# Patient Record
Sex: Male | Born: 1965 | Race: Black or African American | Hispanic: No | Marital: Single
Health system: Southern US, Community
[De-identification: ages and names within clinical notes are randomized; demographics above are authoritative.]

---

## 2017-01-15 ENCOUNTER — Emergency Department
Admission: EM | Admit: 2017-01-15 | Discharge: 2017-01-15 | Disposition: A | Discharge status: Home / Self Care | Payer: Medicaid - Out of State | Attending: Emergency Medicine | Admitting: Emergency Medicine

## 2017-01-15 ENCOUNTER — Emergency Department: Payer: Medicaid - Out of State

## 2017-01-15 DIAGNOSIS — N50811 Right testicular pain: Secondary | ICD-10-CM | POA: Diagnosis present

## 2017-01-15 DIAGNOSIS — N433 Hydrocele, unspecified: Secondary | ICD-10-CM | POA: Diagnosis not present

## 2017-01-15 DIAGNOSIS — N5082 Scrotal pain: Secondary | ICD-10-CM

## 2017-01-15 NOTE — ED Provider Notes (Signed)
[facility] Emergency Department Provider Note  Time seen: 2:31 PM  I have reviewed the triage vital signs and the nursing notes.   HISTORY  Chief Complaint Testicle Pain    HPI Lars MassonRonnie Crute is a 51 y.o. male with a past medical history of epididymitis, who presents to the emergency department for right-sided testicular pain. According to the patient for the past 2-3 years she has been experiencing intermittent pain especially his right testicle. He states over the past one year and has becoming worse and more frequent. Patient has been seen in [place] where he previously lived several times at the hospital and was told that it was epididymitis. Patient denies any penile discharge. Denies any dysuria, fever, nausea vomiting or diarrhea. Denies any swelling of the testicles or discoloration. Patient states it feels like the normal pain that he has 24/7 but feels like it is getting worse.  No past medical history on file.  There are no active problems to display for this patient.   No past surgical history on file.  Prior to Admission medications   Not on File    Allergies  Allergen Reactions  . Acetaminophen Hives  . Aspirin Hives    No family history on file.  Social History Social History  Substance Use Topics  . Smoking status: Not on file  . Smokeless tobacco: Not on file  . Alcohol use Not on file    Review of Systems Constitutional: Negative for fever. Cardiovascular: Negative for chest pain. Respiratory: Negative for shortness of breath. Gastrointestinal: Negative for abdominal pain, vomiting  Genitourinary: Negative for dysuria. Positive for right testicular pain Musculoskeletal: Negative for back pain All other ROS negative  ____________________________________________   PHYSICAL EXAM:  VITAL SIGNS: ED Triage Vitals [01/15/17 1218]  Enc Vitals Group     BP (!) 173/97     Pulse Rate 82     Resp 18     Temp 98.7 F (37.1  C)     Temp Source Oral     SpO2 100 %     Weight 180 lb (81.6 kg)     Height 5\' 10"  (1.778 m)     Head Circumference      Peak Flow      Pain Score 6     Pain Loc      Pain Edu?      Excl. in GC?     Constitutional: Alert and oriented. Well appearing and in no distress. Eyes: Normal exam ENT   Head: Normocephalic and atraumatic.   Mouth/Throat: Mucous membranes are moist. Cardiovascular: Normal rate, regular rhythm. No murmur Respiratory: Normal respiratory effort without tachypnea nor retractions. Breath sounds are clear Gastrointestinal: Soft and nontender. No distention. Genitourinary: Normal GU appearance, moderate tenderness to the right epididymis, testicles themselves appear normal. No edema or discoloration. Musculoskeletal: Nontender with normal range of motion in all extremities.  Neurologic:  Normal speech and language. No gross focal neurologic deficits  Skin:  Skin is warm, dry and intact.  Psychiatric: Mood and affect are normal.  ____________________________________________   RADIOLOGY  IMPRESSION: 1. Normal appearance of the testicles. No evidence of torsion. 2. Small hydroceles.  ____________________________________________   INITIAL IMPRESSION / ASSESSMENT AND PLAN / ED COURSE  Pertinent labs & imaging results that were available during my care of the patient were reviewed by me and considered in my medical decision making (see chart for details).  Patient with recurrent groin/testicular pain intermittent over the past 2-3 years.  Normal exam with mild to moderate epididymis tenderness on the right. No edema or discoloration. Ultrasound shows bilateral hydroceles otherwise negative. Given the patient's chronic discomfort I suggested he follow up with the urologist which he has not done yet. Patient is agreeable to this plan.  ____________________________________________   FINAL CLINICAL IMPRESSION(S) / ED DIAGNOSES  Testicular  pain Hydrocele    Minna AntisPaduchowski, Katalea Ucci, MD 01/15/17 1434

## 2017-01-15 NOTE — ED Triage Notes (Signed)
Pt reports increasing scrotum pain for three weeks. Denies discharge. Pt reports frequent epididymitis.

## 2017-01-15 NOTE — Discharge Instructions (Signed)
Please call the number provided for neurology to arrange a follow-up appointment as soon as possible. Return to the emergency department for any sudden or significant worsening of discomfort, fever, or any other symptom personally concerning to yourself.

## 2017-01-15 NOTE — ED Notes (Addendum)
Pt states he has had epididymitis x 2 years of being diagnosed. States he has had a lot longer. Denies urinary symptoms. States R sided testicular pain.

## 2019-04-17 IMAGING — US US SCROTUM
1 series · 14 of 25 positions shown · non-contrast
Comparison: None.

CLINICAL DATA: Right scrotal pain for 2 weeks.

EXAM:
SCROTAL ULTRASOUND
DOPPLER ULTRASOUND OF THE TESTICLES
TECHNIQUE: Complete ultrasound examination of the testicles, epididymis, and
other scrotal structures was performed. Color and spectral Doppler
ultrasound were also utilized to evaluate blood flow to the
testicles.

[Series 1: us scrotum · 0.08mm/px · 14 of 90 slices shown]
[im 1/90]
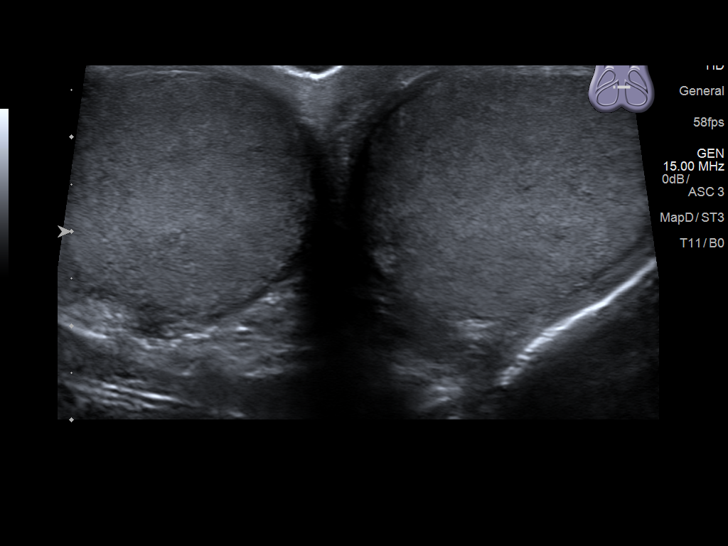
[im 8/90]
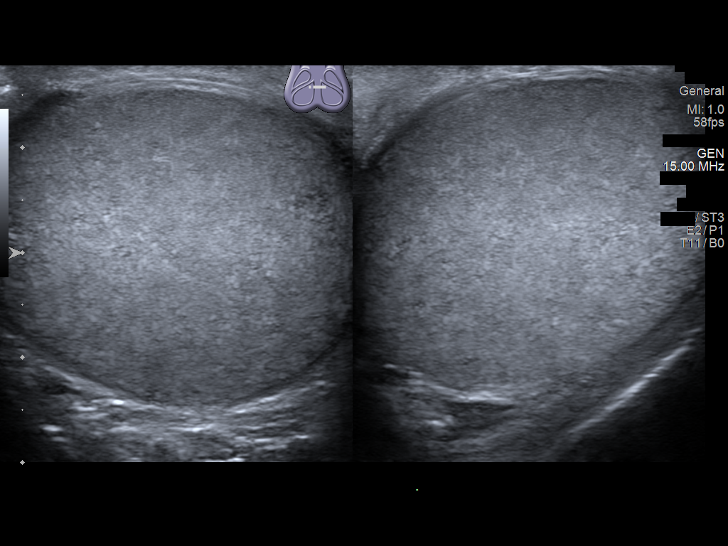
[im 15/90]
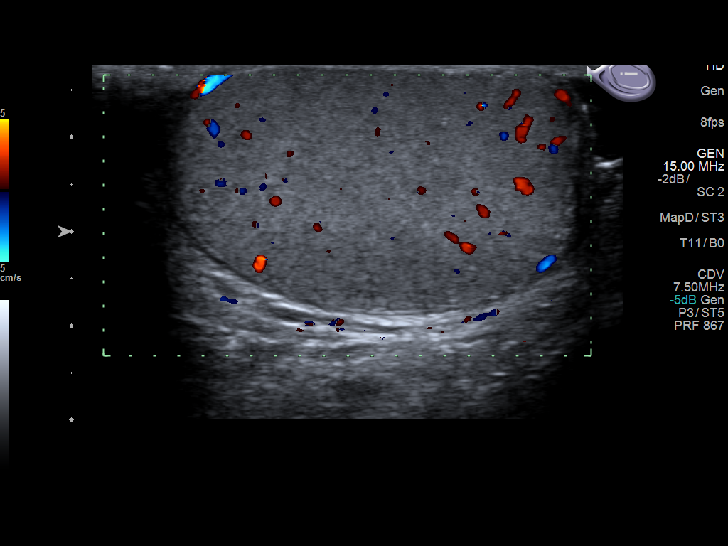
[im 23/90]
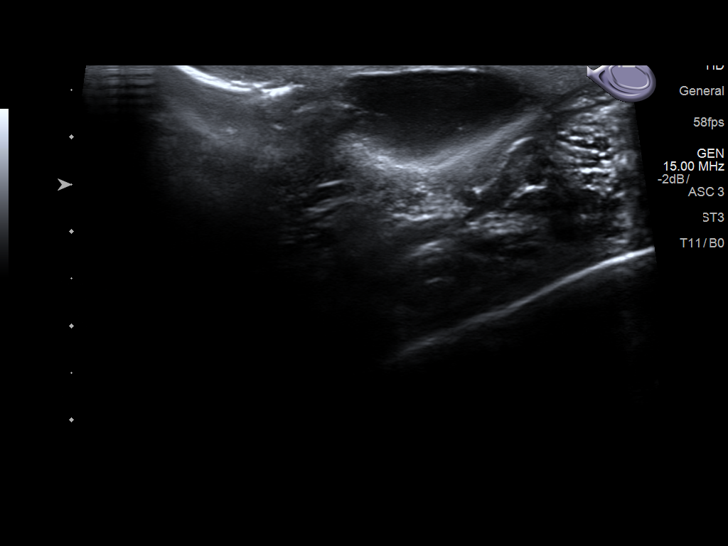
[im 30/90]
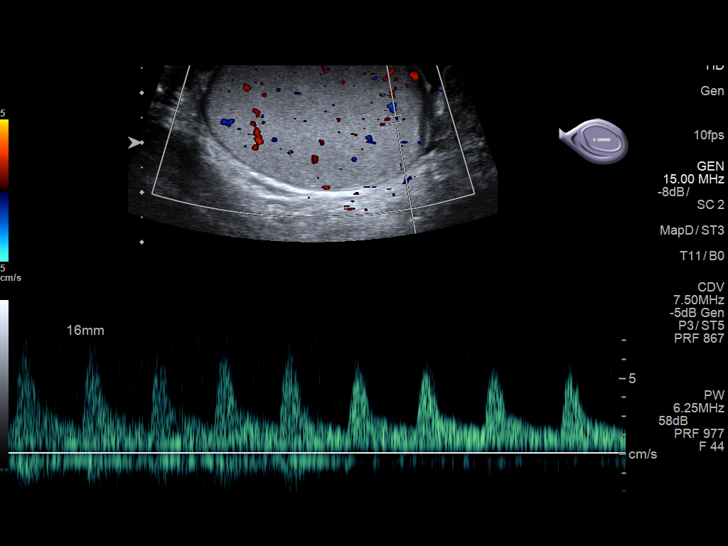
[im 34/90]
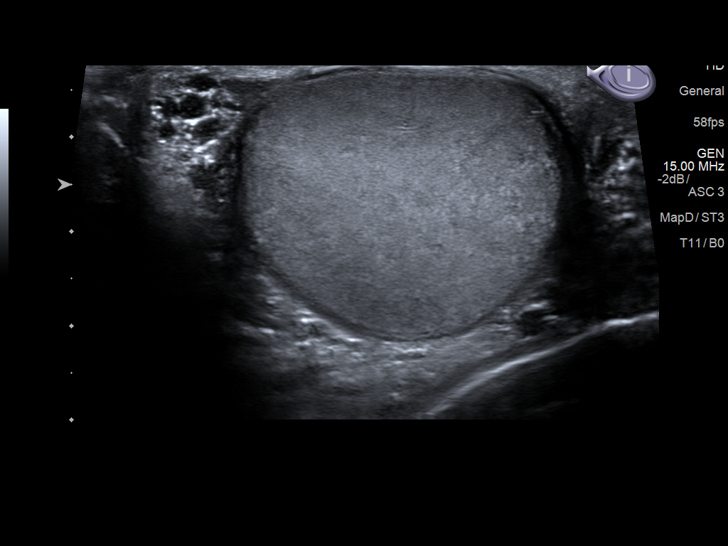
[im 41/90]
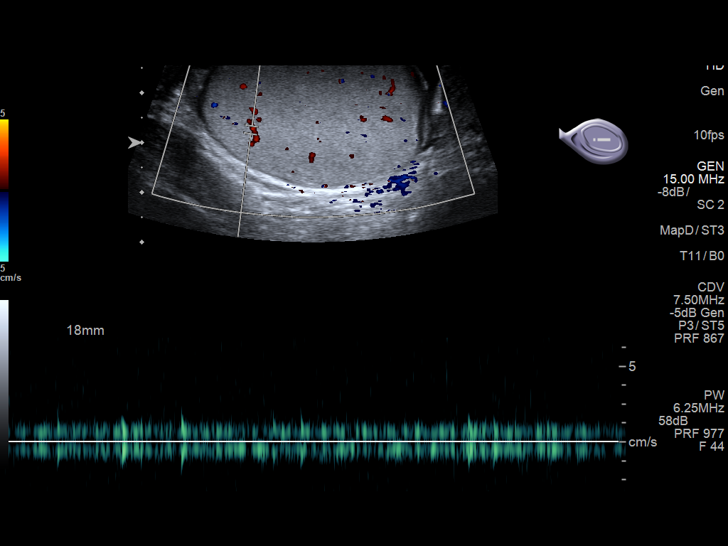
[im 49/90]
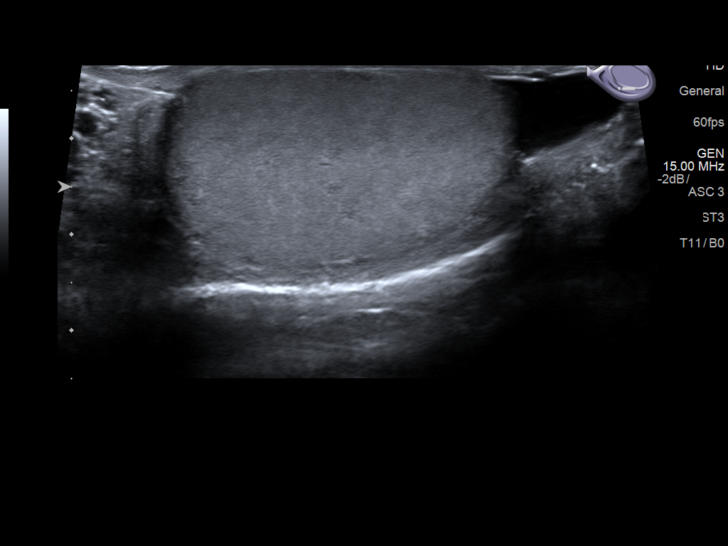
[im 56/90]
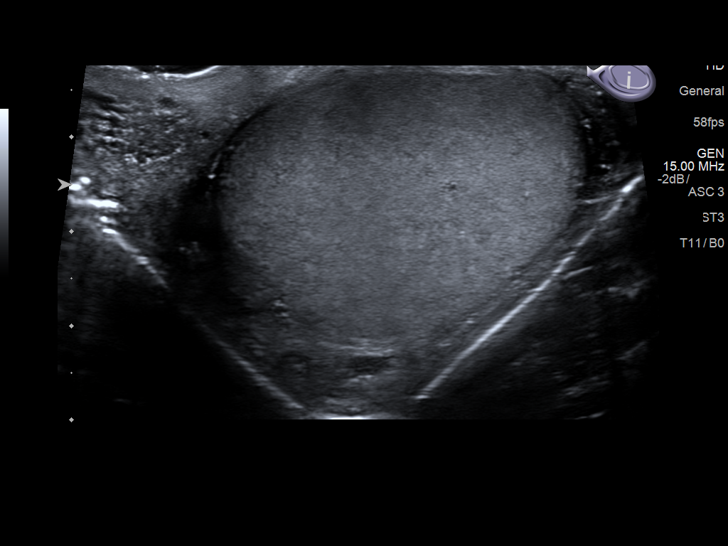
[im 60/90]
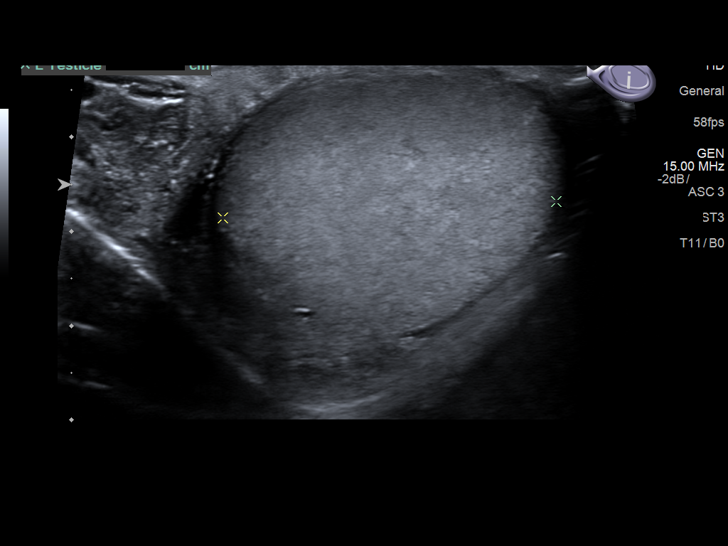
[im 67/90]
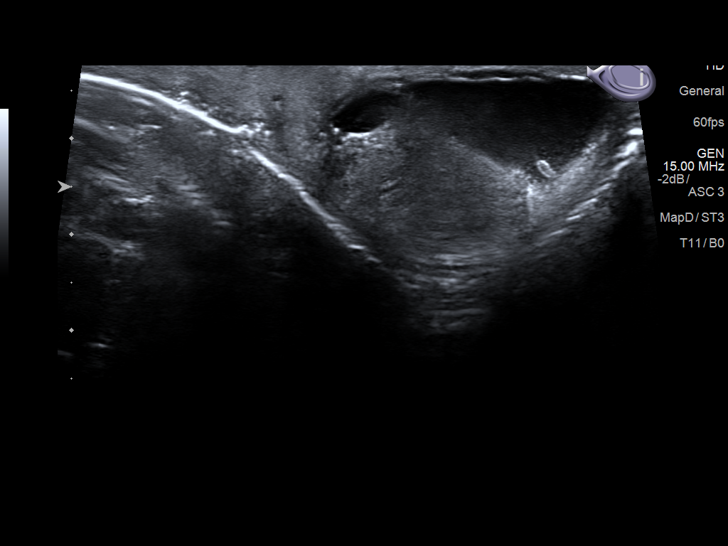
[im 75/90]
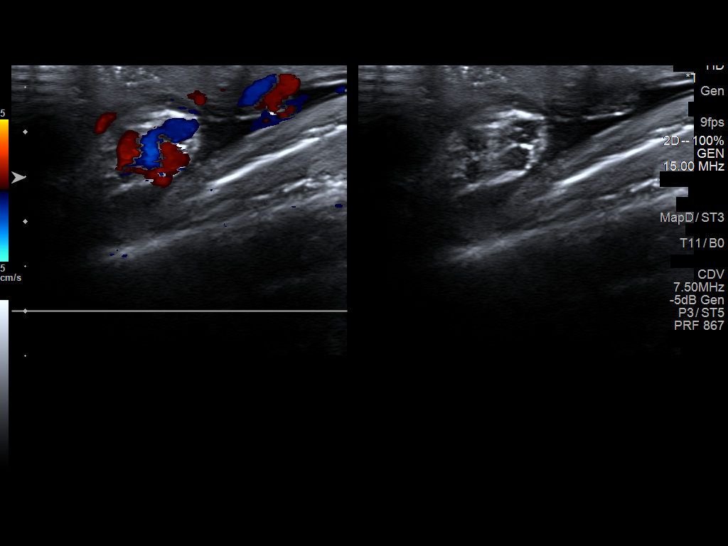
[im 82/90]
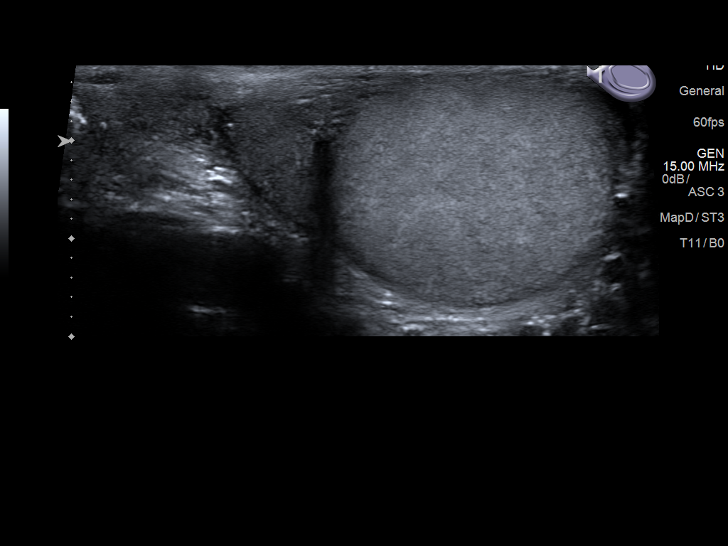
[im 90/90]
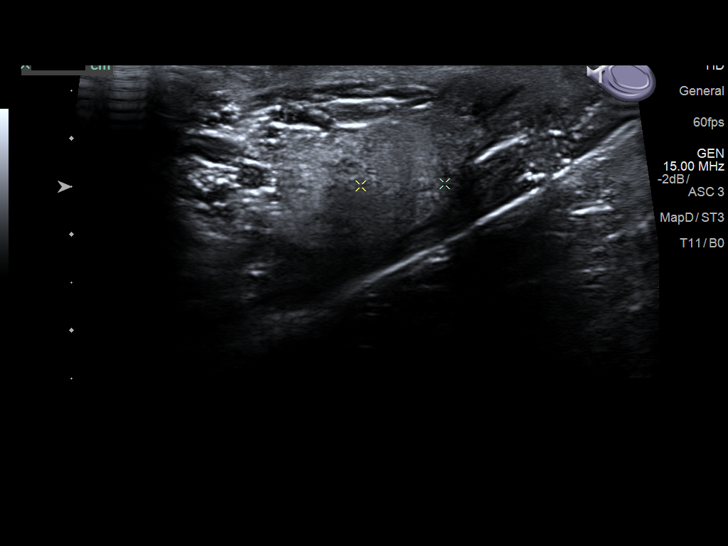

[14 of 25 positions shown; findings below may reference images not displayed]

FINDINGS: Right testicle

Measurements: 4.5 x 2.6 x 3.5 cm. No mass or microlithiasis
visualized.

Left testicle

Measurements: 4.2 x 2.4 x 3.5 cm. No mass or microlithiasis
visualized.

Right epididymis:  Normal in size and appearance.

Left epididymis:  Normal in size and appearance.

Hydrocele:  Small bilateral hydroceles.

Varicocele:  None visualized.

Pulsed Doppler interrogation of both testes demonstrates normal low
resistance arterial and venous waveforms bilaterally.
IMPRESSION: 1. Normal appearance of the testicles.  No evidence of torsion.
2. Small hydroceles.
